# Patient Record
Sex: Female | Born: 1995 | Race: White | Hispanic: No | Marital: Single | Smoking: Never smoker
Health system: Southern US, Community
[De-identification: ages and names within clinical notes are randomized; demographics above are authoritative.]

## PROBLEM LIST (undated history)

## (undated) DIAGNOSIS — N159 Renal tubulo-interstitial disease, unspecified: Secondary | ICD-10-CM

## (undated) DIAGNOSIS — K8020 Calculus of gallbladder without cholecystitis without obstruction: Secondary | ICD-10-CM

## (undated) DIAGNOSIS — N83209 Unspecified ovarian cyst, unspecified side: Secondary | ICD-10-CM

## (undated) DIAGNOSIS — F32A Depression, unspecified: Secondary | ICD-10-CM

## (undated) DIAGNOSIS — F419 Anxiety disorder, unspecified: Secondary | ICD-10-CM

## (undated) HISTORY — DX: Depression, unspecified: F32.A

## (undated) HISTORY — DX: Unspecified ovarian cyst, unspecified side: N83.209

## (undated) HISTORY — DX: Renal tubulo-interstitial disease, unspecified: N15.9

## (undated) HISTORY — PX: MENISCUS REPAIR: SHX5179

## (undated) HISTORY — DX: Anxiety disorder, unspecified: F41.9

## (undated) HISTORY — DX: Calculus of gallbladder without cholecystitis without obstruction: K80.20

---

## 2015-10-02 ENCOUNTER — Other Ambulatory Visit: Payer: Self-pay | Admitting: General Surgery

## 2015-11-15 ENCOUNTER — Emergency Department (HOSPITAL_COMMUNITY): Payer: 59

## 2015-11-15 ENCOUNTER — Emergency Department (HOSPITAL_COMMUNITY)
Admission: EM | Admit: 2015-11-15 | Discharge: 2015-11-16 | Disposition: A | Discharge status: Home / Self Care | Payer: 59 | Attending: Emergency Medicine | Admitting: Emergency Medicine

## 2015-11-15 ENCOUNTER — Encounter: Payer: Self-pay | Admitting: Emergency Medicine

## 2015-11-15 DIAGNOSIS — Z79899 Other long term (current) drug therapy: Secondary | ICD-10-CM | POA: Diagnosis not present

## 2015-11-15 DIAGNOSIS — Z791 Long term (current) use of non-steroidal anti-inflammatories (NSAID): Secondary | ICD-10-CM | POA: Diagnosis not present

## 2015-11-15 DIAGNOSIS — N3000 Acute cystitis without hematuria: Secondary | ICD-10-CM | POA: Diagnosis not present

## 2015-11-15 DIAGNOSIS — Z91040 Latex allergy status: Secondary | ICD-10-CM | POA: Insufficient documentation

## 2015-11-15 DIAGNOSIS — R1031 Right lower quadrant pain: Secondary | ICD-10-CM | POA: Diagnosis present

## 2015-11-15 LAB — COMPREHENSIVE METABOLIC PANEL
ALBUMIN: 4.3 g/dL (ref 3.5–5.0)
ALT: 13 U/L — ABNORMAL LOW (ref 14–54)
AST: 20 U/L (ref 15–41)
Alkaline Phosphatase: 41 U/L (ref 38–126)
Anion gap: 5 (ref 5–15)
BUN: 9 mg/dL (ref 6–20)
CHLORIDE: 110 mmol/L (ref 101–111)
CO2: 24 mmol/L (ref 22–32)
Calcium: 9.5 mg/dL (ref 8.9–10.3)
Creatinine, Ser: 0.63 mg/dL (ref 0.44–1.00)
GFR calc Af Amer: >60 mL/min (ref >60)
Glucose, Bld: 98 mg/dL (ref 65–99)
POTASSIUM: 3.9 mmol/L (ref 3.5–5.1)
SODIUM: 139 mmol/L (ref 135–145)
Total Bilirubin: 0.2 mg/dL — ABNORMAL LOW (ref 0.3–1.2)
Total Protein: 7 g/dL (ref 6.5–8.1)

## 2015-11-15 LAB — CBC
HEMATOCRIT: 34.7 % — AB (ref 36.0–46.0)
Hemoglobin: 11.7 g/dL — ABNORMAL LOW (ref 12.0–15.0)
MCH: 30.3 pg (ref 26.0–34.0)
MCHC: 33.7 g/dL (ref 30.0–36.0)
MCV: 89.9 fL (ref 78.0–100.0)
Platelets: 259 10*3/uL (ref 150–400)
RBC: 3.86 MIL/uL — ABNORMAL LOW (ref 3.87–5.11)
RDW: 12.7 % (ref 11.5–15.5)
WBC: 8 10*3/uL (ref 4.0–10.5)

## 2015-11-15 LAB — LIPASE, BLOOD: Lipase: 38 U/L (ref 11–51)

## 2015-11-15 MED ORDER — ONDANSETRON HCL 4 MG/2ML IJ SOLN
4.0000 mg | Freq: Once | INTRAMUSCULAR | Status: AC
  Administered 2015-11-16: 4 mg via INTRAVENOUS
  Filled 2015-11-15: qty 2

## 2015-11-15 MED ORDER — SODIUM CHLORIDE 0.9 % IV SOLN
1000.0000 mL | INTRAVENOUS | Status: DC
  Administered 2015-11-15: 1000 mL via INTRAVENOUS

## 2015-11-15 MED ORDER — IOPAMIDOL (ISOVUE-300) INJECTION 61%
100.0000 mL | Freq: Once | INTRAVENOUS | Status: AC | PRN
  Administered 2015-11-16: 100 mL via INTRAVENOUS

## 2015-11-15 MED ORDER — MORPHINE SULFATE (PF) 4 MG/ML IV SOLN
4.0000 mg | INTRAVENOUS | Status: DC | PRN
  Administered 2015-11-16 (×2): 4 mg via INTRAVENOUS
  Filled 2015-11-15 (×2): qty 1

## 2015-11-15 MED ORDER — SODIUM CHLORIDE 0.9 % IV SOLN
1000.0000 mL | Freq: Once | INTRAVENOUS | Status: AC
  Administered 2015-11-15: 1000 mL via INTRAVENOUS

## 2015-11-15 MED ORDER — DIATRIZOATE MEGLUMINE & SODIUM 66-10 % PO SOLN
15.0000 mL | Freq: Once | ORAL | Status: AC
  Administered 2015-11-16: 15 mL via ORAL

## 2015-11-15 NOTE — ED Provider Notes (Signed)
[facility]-EMERGENCY DEPT Provider Note   CSN: [id number] Arrival date & time: 11/15/15  2244  By signing my name below, I, Freida Busman, attest that this documentation has been prepared under the direction and in the presence of Azalia Bilis, MD . Electronically Signed: Freida Busman, Scribe. 11/15/2015. 11:40 PM.    History   Chief Complaint Chief Complaint  Patient presents with  . Abdominal Pain     The history is provided by the patient. No language interpreter was used.    HPI Comments:  Chelsea Becker is a 20 y.o. female who presents to the Emergency Department complaining of gradually worsening, constant RLQ pain x 2 days. She reports associated nausea and chills. She reports h/o ovarian cyst; states she was evaluated this AM for her pain and had a negative [place] today. She denies urinary symptoms. No h/o appendectomy.   History reviewed. No pertinent past medical history.  There are no active problems to display for this patient.   History reviewed. No pertinent surgical history.  OB History    Gravida Para Term Preterm AB Living   1             SAB TAB Ectopic Multiple Live Births                   Home Medications    Prior to Admission medications   Medication Sig Start Date End Date Taking? Authorizing Provider  acetaminophen (TYLENOL) 500 MG tablet Take 1,000 mg by mouth every 6 (six) hours as needed (for pain.).   Yes Historical Provider, MD  ibuprofen (ADVIL,MOTRIN) 200 MG tablet Take 200 mg by mouth every 6 (six) hours as needed.   Yes Historical Provider, MD  JUNEL FE 1.5/30 1.5-30 MG-MCG tablet Take 1 tablet by mouth daily. 10/24/15  Yes Historical Provider, MD    Family History No family history on file.  Social History Social History  Substance Use Topics  . Smoking status: Never Smoker  . Smokeless tobacco: Never Used  . Alcohol use No     Allergies   Latex and Penicillins   Review of Systems Review of Systems  10 systems reviewed and all  are negative for acute change except as noted in the HPI.    Physical Exam Updated Vital Signs BP 116/84 (BP Location: Left Arm)   Pulse 78   Temp 98 F (36.7 C) (Oral)   Resp 16   Ht 5\' 5"  (1.651 m)   Wt 98 lb (44.5 kg)   LMP 10/21/2015 (Approximate) Comment: negative pregnancy test result.  SpO2 100%   BMI 16.31 kg/m   Physical Exam  Constitutional: She is oriented to person, place, and time. She appears well-developed and well-nourished. No distress.  HENT:  Head: Normocephalic and atraumatic.  Eyes: EOM are normal.  Neck: Normal range of motion.  Cardiovascular: Normal rate, regular rhythm and normal heart sounds.   Pulmonary/Chest: Effort normal and breath sounds normal.  Abdominal: Soft. She exhibits no distension. There is tenderness in the right lower quadrant.  Musculoskeletal: Normal range of motion.  Neurological: She is alert and oriented to person, place, and time.  Skin: Skin is warm and dry.  Psychiatric: She has a normal mood and affect. Judgment normal.  Nursing note and vitals reviewed.    ED Treatments / Results  DIAGNOSTIC STUDIES:  Oxygen Saturation is 99% on RA, normal by my interpretation.    COORDINATION OF CARE:  11:35 PM Discussed treatment plan with pt at bedside  and pt agreed to plan.  Labs (all labs ordered are listed, but only abnormal results are displayed) Labs Reviewed  COMPREHENSIVE METABOLIC PANEL - Abnormal; Notable for the following:       Result Value   ALT 13 (*)    Total Bilirubin 0.2 (*)    All other components within normal limits  CBC - Abnormal; Notable for the following:    RBC 3.86 (*)    Hemoglobin 11.7 (*)    HCT 34.7 (*)    All other components within normal limits  URINALYSIS, ROUTINE W REFLEX MICROSCOPIC (NOT AT [facility]) - Abnormal; Notable for the following:    APPearance CLOUDY (*)    Leukocytes, UA LARGE (*)    All other components within normal limits  URINE MICROSCOPIC-ADD ON - Abnormal; Notable for the  following:    Squamous Epithelial / LPF 6-30 (*)    Bacteria, UA FEW (*)    All other components within normal limits  URINE CULTURE  LIPASE, BLOOD  PREGNANCY, URINE    EKG  EKG Interpretation None       Radiology Ct Abdomen Pelvis W Contrast  Result Date: 11/16/2015 CLINICAL DATA:  Right lower quadrant pain.  Tenderness on exam. EXAM: CT ABDOMEN AND PELVIS WITH CONTRAST TECHNIQUE: Multidetector CT imaging of the abdomen and pelvis was performed using the standard protocol following bolus administration of intravenous contrast. CONTRAST:  ISOVUE-300 IOPAMIDOL (ISOVUE-300) INJECTION 61% COMPARISON:  None. FINDINGS: Lower chest:  The included lung bases are clear. Liver: Tiny subcentimeter hypodensity in the subcapsular right lobe of the liver it is too small to characterize, likely an incidental cyst. Hepatobiliary: Gallbladder physiologically distended, no calcified stone. No biliary dilatation. Pancreas: No ductal dilatation or inflammation. Spleen: Normal. Adrenal glands: No nodule. Kidneys: Mild prominence of the right renal pelvis renal collecting system with slight urothelial enhancement. Homogeneous symmetric renal enhancement. No urolithiasis. No intrarenal or perirenal fluid collection. Stomach/Bowel: Stomach physiologically distended. There are no dilated or thickened small bowel loops. Moderate stool burden without colonic wall thickening. The appendix is normal. Vascular/Lymphatic: No retroperitoneal adenopathy. Abdominal aorta is normal in caliber. Reproductive: Prominent periuterine and adnexal vascularity. Ovaries not discretely identified. No adnexal mass. Mild prominence of the left ovarian veins. Bladder: Mildly distended without wall thickening. Other: Small amount of free fluid in the pelvis measures simple fluid density. No free air. No intra-abdominal abscess. Musculoskeletal: There are no acute or suspicious osseous abnormalities. IMPRESSION: 1. Normal appendix. 2. Mild  right urothelial enhancement, suspicious for ascending urinary tract infection. Electronically Signed   By: Rubye Oaks M.D.   On: 11/16/2015 01:09    Procedures Procedures (including critical care time)  Medications Ordered in ED Medications  morphine 4 MG/ML injection 4 mg (4 mg Intravenous Given 11/16/15 0153)  0.9 %  sodium chloride infusion (0 mLs Intravenous Stopped 11/16/15 0153)    Followed by  0.9 %  sodium chloride infusion (0 mLs Intravenous Stopped 11/16/15 0153)  cefTRIAXone (ROCEPHIN) 1 g in dextrose 5 % 50 mL IVPB (1 g Intravenous New Bag/Given 11/16/15 0152)  ondansetron (ZOFRAN) injection 4 mg (4 mg Intravenous Given 11/16/15 0001)  diatrizoate meglumine-sodium (GASTROGRAFIN) 66-10 % solution 15 mL (15 mLs Oral Given 11/16/15 0048)  iopamidol (ISOVUE-300) 61 % injection 100 mL (100 mLs Intravenous Contrast Given 11/16/15 0047)     Initial Impression / Assessment and Plan / ED Course  I have reviewed the triage vital signs and the nursing notes.  Pertinent labs & imaging results  that were available during my care of the patient were reviewed by me and considered in my medical decision making (see chart for details).  Clinical Course    2:23 AM Patient feels better this time.  Appendix normal on CT imaging.  Likely a ascending urinary tract infection.  Urine culture sent.  Rocephin in the emergency department.  Home with Keflex, nausea medicine, pain medicine.  Patient understands return to the ER for new or worsening symptoms.  Overall well-appearing.  Nontoxic.  Final Clinical Impressions(s) / ED Diagnoses   Final diagnoses:  Right lower quadrant abdominal pain  Acute cystitis without hematuria    New Prescriptions New Prescriptions   CEPHALEXIN (KEFLEX) 500 MG CAPSULE    Take 1 capsule (500 mg total) by mouth 3 (three) times daily.   HYDROCODONE-ACETAMINOPHEN (NORCO/VICODIN) 5-325 MG TABLET    Take 1 tablet by mouth every 6 (six) hours as needed for moderate  pain.   ONDANSETRON (ZOFRAN ODT) 8 MG DISINTEGRATING TABLET    Take 1 tablet (8 mg total) by mouth every 8 (eight) hours as needed for nausea or vomiting.    I personally performed the services described in this documentation, which was scribed in my presence. The recorded information has been reviewed and is accurate.        Azalia BilisKevin Bernardo Brayman, MD 11/16/15 [phone]

## 2015-11-15 NOTE — ED Triage Notes (Signed)
Pt complains of RLQ pain since yesterday. Pt had ultrasound performed at PCP, which was negative. Pt states her pain feels worse.

## 2015-11-16 LAB — URINE MICROSCOPIC-ADD ON: RBC / HPF: NONE SEEN RBC/hpf (ref 0–5)

## 2015-11-16 LAB — URINALYSIS, ROUTINE W REFLEX MICROSCOPIC
BILIRUBIN URINE: NEGATIVE
Glucose, UA: NEGATIVE mg/dL
HGB URINE DIPSTICK: NEGATIVE
KETONES UR: NEGATIVE mg/dL
Nitrite: NEGATIVE
PROTEIN: NEGATIVE mg/dL
Specific Gravity, Urine: 1.022 (ref 1.005–1.030)
pH: 6.5 (ref 5.0–8.0)

## 2015-11-16 LAB — PREGNANCY, URINE: PREG TEST UR: NEGATIVE

## 2015-11-16 MED ORDER — CEFTRIAXONE SODIUM 1 G IJ SOLR
1.0000 g | Freq: Once | INTRAMUSCULAR | Status: AC
  Administered 2015-11-16: 1 g via INTRAVENOUS
  Filled 2015-11-16: qty 10

## 2015-11-16 MED ORDER — CEPHALEXIN 500 MG PO CAPS: 500.0000 mg | ORAL_CAPSULE | Freq: Three times a day (TID) | ORAL | 0 refills | Status: DC

## 2015-11-16 MED ORDER — ONDANSETRON 8 MG PO TBDP: 8.0000 mg | ORAL_TABLET | Freq: Three times a day (TID) | ORAL | 0 refills | Status: DC | PRN

## 2015-11-16 MED ORDER — HYDROCODONE-ACETAMINOPHEN 5-325 MG PO TABS: 1.0000 | ORAL_TABLET | Freq: Four times a day (QID) | ORAL | 0 refills | Status: DC | PRN

## 2015-11-16 NOTE — ED Notes (Signed)
Pt returned from CT °

## 2015-11-17 LAB — URINE CULTURE

## 2015-11-22 ENCOUNTER — Encounter (HOSPITAL_COMMUNITY): Payer: Self-pay | Admitting: Emergency Medicine

## 2015-11-22 ENCOUNTER — Emergency Department (HOSPITAL_COMMUNITY)
Admission: EM | Admit: 2015-11-22 | Discharge: 2015-11-22 | Disposition: A | Discharge status: Home / Self Care | Payer: 59 | Attending: Emergency Medicine | Admitting: Emergency Medicine

## 2015-11-22 DIAGNOSIS — R110 Nausea: Secondary | ICD-10-CM | POA: Insufficient documentation

## 2015-11-22 DIAGNOSIS — N760 Acute vaginitis: Secondary | ICD-10-CM | POA: Insufficient documentation

## 2015-11-22 DIAGNOSIS — Z792 Long term (current) use of antibiotics: Secondary | ICD-10-CM | POA: Diagnosis not present

## 2015-11-22 DIAGNOSIS — R102 Pelvic and perineal pain: Secondary | ICD-10-CM

## 2015-11-22 LAB — URINALYSIS, ROUTINE W REFLEX MICROSCOPIC
BILIRUBIN URINE: NEGATIVE
GLUCOSE, UA: NEGATIVE mg/dL
Hgb urine dipstick: NEGATIVE
KETONES UR: NEGATIVE mg/dL
NITRITE: NEGATIVE
PH: 6 (ref 5.0–8.0)
Protein, ur: NEGATIVE mg/dL
Specific Gravity, Urine: 1.004 — ABNORMAL LOW (ref 1.005–1.030)

## 2015-11-22 LAB — COMPREHENSIVE METABOLIC PANEL
ALBUMIN: 4.5 g/dL (ref 3.5–5.0)
ALK PHOS: 38 U/L (ref 38–126)
ALT: 11 U/L — AB (ref 14–54)
AST: 20 U/L (ref 15–41)
Anion gap: 6 (ref 5–15)
BILIRUBIN TOTAL: 0.4 mg/dL (ref 0.3–1.2)
BUN: 6 mg/dL (ref 6–20)
CO2: 25 mmol/L (ref 22–32)
CREATININE: 0.72 mg/dL (ref 0.44–1.00)
Calcium: 9.5 mg/dL (ref 8.9–10.3)
Chloride: 107 mmol/L (ref 101–111)
GFR calc Af Amer: >60 mL/min (ref >60)
GFR calc non Af Amer: >60 mL/min (ref >60)
GLUCOSE: 115 mg/dL — AB (ref 65–99)
POTASSIUM: 3.7 mmol/L (ref 3.5–5.1)
Sodium: 138 mmol/L (ref 135–145)
TOTAL PROTEIN: 7.9 g/dL (ref 6.5–8.1)

## 2015-11-22 LAB — CBC
HEMATOCRIT: 38.2 % (ref 36.0–46.0)
Hemoglobin: 13 g/dL (ref 12.0–15.0)
MCH: 31.3 pg (ref 26.0–34.0)
MCHC: 34 g/dL (ref 30.0–36.0)
MCV: 92 fL (ref 78.0–100.0)
Platelets: 289 10*3/uL (ref 150–400)
RBC: 4.15 MIL/uL (ref 3.87–5.11)
RDW: 12.6 % (ref 11.5–15.5)
WBC: 8.1 10*3/uL (ref 4.0–10.5)

## 2015-11-22 LAB — URINE MICROSCOPIC-ADD ON: RBC / HPF: NONE SEEN RBC/hpf (ref 0–5)

## 2015-11-22 LAB — WET PREP, GENITAL
Sperm: NONE SEEN
Trich, Wet Prep: NONE SEEN
YEAST WET PREP: NONE SEEN

## 2015-11-22 LAB — LIPASE, BLOOD: Lipase: 31 U/L (ref 11–51)

## 2015-11-22 MED ORDER — METRONIDAZOLE 500 MG PO TABS: 500.0000 mg | ORAL_TABLET | Freq: Two times a day (BID) | ORAL | 0 refills | Status: DC

## 2015-11-22 MED ORDER — IBUPROFEN 800 MG PO TABS: 800.0000 mg | ORAL_TABLET | Freq: Three times a day (TID) | ORAL | 0 refills | Status: DC | PRN

## 2015-11-22 MED ORDER — FLUCONAZOLE 150 MG PO TABS
150.0000 mg | ORAL_TABLET | Freq: Once | ORAL | Status: AC
  Administered 2015-11-22: 150 mg via ORAL
  Filled 2015-11-22: qty 1

## 2015-11-22 MED ORDER — KETOROLAC TROMETHAMINE 60 MG/2ML IM SOLN
30.0000 mg | Freq: Once | INTRAMUSCULAR | Status: AC
  Administered 2015-11-22: 30 mg via INTRAMUSCULAR
  Filled 2015-11-22: qty 2

## 2015-11-22 NOTE — ED Provider Notes (Signed)
[facility]-EMERGENCY DEPT Provider Note   CSN: [id number] Arrival date & time: 11/22/15  1710     History   Chief Complaint Chief Complaint  Patient presents with  . Abdominal Pain  . Nausea    HPI Chelsea Becker is a 20 y.o. female.  HPI   Patient presents with persistent RLQ pain that has been ongoing x 2 weeks.  Pain is sharp and intermittent.  She has also had abnormal vaginal discharge.  LMP mid-July.  Recently started birth control.  Has been seen for this several times at [facility] (gyn) and had US to r/o new ovarian cyst but has not had pelvic exam.  Was seen in ED 11/15/15 with CT abd/pelvis that showed a normal appendix, ureteral inflammation c/w ascending UTI.  Pt has developed fevers, chills, back pain. The abdominal pain has remained consistent, no sudden worsening or change.  Pt was also seen at a [facility] ED and had CT abd/pelvis in early July that showed ovarian cysts. No hx abdominal surgeries.  Denies urinary symptoms.  Has had mild constipation since starting the vicodin.  Has taken keflex, vicodin, nausea medication.  Denies having ever been sexually active.    History reviewed. No pertinent past medical history.  There are no active problems to display for this patient.   History reviewed. No pertinent surgical history.  OB History    Gravida Para Term Preterm AB Living   1             SAB TAB Ectopic Multiple Live Births                   Home Medications    Prior to Admission medications   Medication Sig Start Date End Date Taking? Authorizing Provider  acetaminophen (TYLENOL) 500 MG tablet Take 1,000 mg by mouth every 6 (six) hours as needed (for pain.).   Yes Historical Provider, MD  cephALEXin (KEFLEX) 500 MG capsule Take 1 capsule (500 mg total) by mouth 3 (three) times daily. 11/16/15  Yes Azalia BilisKevin Campos, MD  HYDROcodone-acetaminophen (NORCO/VICODIN) 5-325 MG tablet Take 1 tablet by mouth every 6 (six) hours as needed for moderate pain. 11/16/15   Yes Azalia BilisKevin Campos, MD  JUNEL FE 1.5/30 1.5-30 MG-MCG tablet Take 1 tablet by mouth daily. 10/24/15  Yes Historical Provider, MD  ondansetron (ZOFRAN ODT) 8 MG disintegrating tablet Take 1 tablet (8 mg total) by mouth every 8 (eight) hours as needed for nausea or vomiting. 11/16/15  Yes Azalia BilisKevin Campos, MD  ibuprofen (ADVIL,MOTRIN) 800 MG tablet Take 1 tablet (800 mg total) by mouth every 8 (eight) hours as needed for mild pain or moderate pain. 11/22/15   Trixie DredgeEmily Aren Cherne, PA-C  metroNIDAZOLE (FLAGYL) 500 MG tablet Take 1 tablet (500 mg total) by mouth 2 (two) times daily. One po bid x 7 days 11/22/15   Trixie DredgeEmily Amiel Sharrow, PA-C    Family History No family history on file.  Social History Social History  Substance Use Topics  . Smoking status: Never Smoker  . Smokeless tobacco: Never Used  . Alcohol use No     Allergies   Latex and Penicillins   Review of Systems Review of Systems  All other systems reviewed and are negative.    Physical Exam Updated Vital Signs BP 115/72 (BP Location: Left Arm)   Pulse 86   Temp 98.3 F (36.8 C) (Oral)   Resp 17   Ht 5\' 5"  (1.651 m)   Wt 44.5 kg   LMP  10/21/2015 (Approximate) Comment: negative pregnancy test result.  SpO2 100%   Breastfeeding? Unknown   BMI 16.31 kg/m   Physical Exam  Constitutional: She appears well-developed and well-nourished. No distress.  HENT:  Head: Normocephalic and atraumatic.  Neck: Neck supple.  Cardiovascular: Normal rate and regular rhythm.   Pulmonary/Chest: Effort normal and breath sounds normal. No respiratory distress. She has no wheezes. She has no rales.  Abdominal: Soft. She exhibits no distension. There is tenderness. There is no rebound and no guarding.    Genitourinary:  Genitourinary Comments: Thick white vaginal discharge with thin discharge in vagina.  Vagina is tender throughout.  Pt does not tolerate full speculum exam and does not tolerate bimanual.  Repalpation of pelvis external demonstrates tenderness  throughout, left, right, midline.  No rebound or guarding.  No masses or fullness.    Neurological: She is alert.  Skin: She is not diaphoretic.  Nursing note and vitals reviewed.    ED Treatments / Results  Labs (all labs ordered are listed, but only abnormal results are displayed) Labs Reviewed  WET PREP, GENITAL - Abnormal; Notable for the following:       Result Value   Clue Cells Wet Prep HPF POC PRESENT (*)    WBC, Wet Prep HPF POC MANY (*)    All other components within normal limits  COMPREHENSIVE METABOLIC PANEL - Abnormal; Notable for the following:    Glucose, Bld 115 (*)    ALT 11 (*)    All other components within normal limits  URINALYSIS, ROUTINE W REFLEX MICROSCOPIC (NOT AT [facility]) - Abnormal; Notable for the following:    Specific Gravity, Urine 1.004 (*)    Leukocytes, UA SMALL (*)    All other components within normal limits  URINE MICROSCOPIC-ADD ON - Abnormal; Notable for the following:    Squamous Epithelial / LPF 0-5 (*)    Bacteria, UA RARE (*)    All other components within normal limits  URINE CULTURE  LIPASE, BLOOD  CBC  RPR  HIV ANTIBODY (ROUTINE TESTING)  GC/CHLAMYDIA PROBE AMP () NOT AT [facility]    EKG  EKG Interpretation None       Radiology No results found.  Procedures Procedures (including critical care time)  Medications Ordered in ED Medications  ketorolac (TORADOL) injection 30 mg (30 mg Intramuscular Given 11/22/15 2210)  fluconazole (DIFLUCAN) tablet 150 mg (150 mg Oral Given 11/22/15 2210)     Initial Impression / Assessment and Plan / ED Course  I have reviewed the triage vital signs and the nursing notes.  Pertinent labs & imaging results that were available during my care of the patient were reviewed by me and considered in my medical decision making (see chart for details).  Clinical Course    Afebrile, nontoxic patient with pelvic pain, abnormal vaginal discharge.  Pt has significant amount of discharge and  tenderness.  Pt denies any history of sexual activity or any possibility of STDs.  Will treat for clinical yeast infection as well as BV.  UA does not appear infected, will send for culture.  Labs unremarkable.  Pt has had pelvic and transvaginal US in the past and does not tolerate them well at all.  Pt has had [place] in gyn office this week negative for cysts.  Doubt TOA, doubt torsion.   D/C home with flagyl, motrin, gyn follow up.  Discussed result, findings, treatment, and follow up  with patient.  Pt given return precautions.  Pt verbalizes understanding  and agrees with plan.       Final Clinical Impressions(s) / ED Diagnoses   Final diagnoses:  Vaginitis  Pelvic pain in female    New Prescriptions New Prescriptions   IBUPROFEN (ADVIL,MOTRIN) 800 MG TABLET    Take 1 tablet (800 mg total) by mouth every 8 (eight) hours as needed for mild pain or moderate pain.   METRONIDAZOLE (FLAGYL) 500 MG TABLET    Take 1 tablet (500 mg total) by mouth 2 (two) times daily. One po bid x 7 days     Trixie Dredge, PA-C 11/22/15 2226    Pricilla Loveless, MD 11/24/15 [phone]

## 2015-11-22 NOTE — Discharge Instructions (Signed)
Read the information below.  Use the prescribed medication as directed.  Please discuss all new medications with your pharmacist.  You may return to the Emergency Department at any time for worsening condition or any new symptoms that concern you.   If you develop high fevers, worsening abdominal pain, uncontrolled vomiting, or are unable to tolerate fluids by mouth, return to the ER for a recheck.  ° °

## 2015-11-22 NOTE — ED Notes (Signed)
Patient reports she was diagnosed with UTI 1.5 weeks ago. Has been taking antibiotics and pain medication without relief. Reports RLQ abdominal pain and nausea. Denies fever or emesis. A&O x4.

## 2015-11-23 LAB — HIV ANTIBODY (ROUTINE TESTING W REFLEX): HIV SCREEN 4TH GENERATION: NONREACTIVE

## 2015-11-23 LAB — RPR: RPR: NONREACTIVE

## 2015-11-24 LAB — URINE CULTURE

## 2015-11-26 LAB — GC/CHLAMYDIA PROBE AMP (~~LOC~~) NOT AT ARMC
Chlamydia: NEGATIVE
Neisseria Gonorrhea: NEGATIVE

## 2016-01-26 ENCOUNTER — Encounter (HOSPITAL_COMMUNITY): Payer: Self-pay | Admitting: Emergency Medicine

## 2016-01-26 ENCOUNTER — Emergency Department (HOSPITAL_COMMUNITY)
Admission: EM | Admit: 2016-01-26 | Discharge: 2016-01-26 | Disposition: A | Discharge status: Home / Self Care | Payer: 59 | Attending: Emergency Medicine | Admitting: Emergency Medicine

## 2016-01-26 ENCOUNTER — Emergency Department (HOSPITAL_COMMUNITY): Payer: 59

## 2016-01-26 DIAGNOSIS — Z79899 Other long term (current) drug therapy: Secondary | ICD-10-CM | POA: Diagnosis not present

## 2016-01-26 DIAGNOSIS — R51 Headache: Secondary | ICD-10-CM | POA: Diagnosis present

## 2016-01-26 DIAGNOSIS — R519 Headache, unspecified: Secondary | ICD-10-CM

## 2016-01-26 DIAGNOSIS — Z91040 Latex allergy status: Secondary | ICD-10-CM | POA: Insufficient documentation

## 2016-01-26 LAB — CBC WITH DIFFERENTIAL/PLATELET
Basophils Absolute: 0 10*3/uL (ref 0.0–0.1)
Basophils Relative: 1 %
Eosinophils Absolute: 0.2 10*3/uL (ref 0.0–0.7)
Eosinophils Relative: 2 %
HCT: 41.3 % (ref 36.0–46.0)
Hemoglobin: 14.1 g/dL (ref 12.0–15.0)
Lymphocytes Relative: 38 %
Lymphs Abs: 2.8 10*3/uL (ref 0.7–4.0)
MCH: 31.2 pg (ref 26.0–34.0)
MCHC: 34.1 g/dL (ref 30.0–36.0)
MCV: 91.4 fL (ref 78.0–100.0)
Monocytes Absolute: 0.4 10*3/uL (ref 0.1–1.0)
Monocytes Relative: 5 %
Neutro Abs: 4.1 10*3/uL (ref 1.7–7.7)
Neutrophils Relative %: 54 %
Platelets: 304 10*3/uL (ref 150–400)
RBC: 4.52 MIL/uL (ref 3.87–5.11)
RDW: 12 % (ref 11.5–15.5)
WBC: 7.4 10*3/uL (ref 4.0–10.5)

## 2016-01-26 LAB — BASIC METABOLIC PANEL
Anion gap: 8 (ref 5–15)
BUN: 8 mg/dL (ref 6–20)
CO2: 27 mmol/L (ref 22–32)
Calcium: 10.1 mg/dL (ref 8.9–10.3)
Chloride: 106 mmol/L (ref 101–111)
Creatinine, Ser: 0.69 mg/dL (ref 0.44–1.00)
GFR calc Af Amer: >60 mL/min (ref >60)
GFR calc non Af Amer: >60 mL/min (ref >60)
Glucose, Bld: 83 mg/dL (ref 65–99)
Potassium: 3.8 mmol/L (ref 3.5–5.1)
Sodium: 141 mmol/L (ref 135–145)

## 2016-01-26 LAB — RAPID URINE DRUG SCREEN, HOSP PERFORMED
Amphetamines: NOT DETECTED
Barbiturates: NOT DETECTED
Benzodiazepines: NOT DETECTED
Cocaine: NOT DETECTED
Opiates: NOT DETECTED
Tetrahydrocannabinol: NOT DETECTED

## 2016-01-26 LAB — URINE MICROSCOPIC-ADD ON
Bacteria, UA: NONE SEEN
RBC / HPF: NONE SEEN RBC/hpf (ref 0–5)

## 2016-01-26 LAB — URINALYSIS, ROUTINE W REFLEX MICROSCOPIC
Bilirubin Urine: NEGATIVE
Glucose, UA: NEGATIVE mg/dL
Ketones, ur: NEGATIVE mg/dL
Nitrite: NEGATIVE
Protein, ur: NEGATIVE mg/dL
Specific Gravity, Urine: 1.011 (ref 1.005–1.030)
pH: 7 (ref 5.0–8.0)

## 2016-01-26 LAB — PREGNANCY, URINE: Preg Test, Ur: NEGATIVE

## 2016-01-26 MED ORDER — KETOROLAC TROMETHAMINE 30 MG/ML IJ SOLN
30.0000 mg | Freq: Once | INTRAMUSCULAR | Status: AC
  Administered 2016-01-26: 30 mg via INTRAVENOUS
  Filled 2016-01-26: qty 1

## 2016-01-26 MED ORDER — PROCHLORPERAZINE EDISYLATE 5 MG/ML IJ SOLN
5.0000 mg | Freq: Once | INTRAMUSCULAR | Status: AC
  Administered 2016-01-26: 5 mg via INTRAVENOUS
  Filled 2016-01-26: qty 2

## 2016-01-26 MED ORDER — SODIUM CHLORIDE 0.9 % IV BOLUS (SEPSIS)
1000.0000 mL | Freq: Once | INTRAVENOUS | Status: AC
  Administered 2016-01-26: 1000 mL via INTRAVENOUS

## 2016-01-26 MED ORDER — DIPHENHYDRAMINE HCL 50 MG/ML IJ SOLN
25.0000 mg | Freq: Once | INTRAMUSCULAR | Status: AC
  Administered 2016-01-26: 25 mg via INTRAVENOUS
  Filled 2016-01-26: qty 1

## 2016-01-26 NOTE — Discharge Instructions (Signed)
Please follow-up with neurology, either one of the offices listed below or another neurologist near [place]. You can take ibuprofen or Tylenol as needed for your headache turns. Make sure to drink plenty of water. Please return to emergency department if you develop any new or worsening symptoms.

## 2016-01-26 NOTE — ED Notes (Signed)
Patient transported to CT 

## 2016-01-26 NOTE — ED Provider Notes (Signed)
[facility]-EMERGENCY DEPT Provider Note   CSN: [id number] Arrival date & time: 01/26/16  1335     History   Chief Complaint Chief Complaint  Patient presents with  . Headache  . Hallucinations    HPI Chelsea Becker is a 20 y.o. female with history of 3 past concussions, one most recently 1 month ago, who presents with headache and visual hallucinations. Patient reports she began with a throbbing, temporal headache 5 days ago. Patient had 2 episodes of visual hallucinations on evening of day one in the morning of day 2, seeing spiders that weren't present. She denies any auditory hallucinations, SI or HI. Patient was diagnosed with a mild concussion 1 month ago after hitting her head on the dryer door. She experiences headache, nausea, irritability, confusion. The symptoms resolved within 1 week. She does not have a CT at that time. Patient notes that [place] [employer] [place] and was drinking drinks that were supposedly being watched, however she is not sure if the drinks may have been spiked. Associated nausea, fatigue. She denies any vision changes, fevers, chest pain, shortness of breath, abdominal pain, vomiting, urinary symptoms.   Headache   Pertinent negatives include no fever, no shortness of breath, no nausea and no vomiting.    History reviewed. No pertinent past medical history.  There are no active problems to display for this patient.   History reviewed. No pertinent surgical history.  OB History    Gravida Para Term Preterm AB Living   1             SAB TAB Ectopic Multiple Live Births                   Home Medications    Prior to Admission medications   Medication Sig Start Date End Date Taking? Authorizing Provider  acetaminophen (TYLENOL) 500 MG tablet Take 1,000 mg by mouth every 6 (six) hours as needed for mild pain or headache.    Yes Historical Provider, MD  JUNEL FE 1.5/30 1.5-30 MG-MCG tablet Take 1 tablet by mouth daily. 10/24/15  Yes  Historical Provider, MD  cephALEXin (KEFLEX) 500 MG capsule Take 1 capsule (500 mg total) by mouth 3 (three) times daily. Patient not taking: Reported on 01/26/2016 11/16/15   Azalia BilisKevin Campos, MD  ibuprofen (ADVIL,MOTRIN) 800 MG tablet Take 1 tablet (800 mg total) by mouth every 8 (eight) hours as needed for mild pain or moderate pain. Patient not taking: Reported on 01/26/2016 11/22/15   Trixie DredgeEmily West, PA-C  metroNIDAZOLE (FLAGYL) 500 MG tablet Take 1 tablet (500 mg total) by mouth 2 (two) times daily. One po bid x 7 days Patient not taking: Reported on 01/26/2016 11/22/15   Trixie DredgeEmily West, PA-C    Family History History reviewed. No pertinent family history.  Social History Social History  Substance Use Topics  . Smoking status: Never Smoker  . Smokeless tobacco: Never Used  . Alcohol use No     Allergies   Latex and Penicillins   Review of Systems Review of Systems  Constitutional: Negative for chills and fever.  HENT: Negative for facial swelling and sore throat.   Eyes: Negative for visual disturbance.  Respiratory: Negative for shortness of breath.   Cardiovascular: Negative for chest pain.  Gastrointestinal: Negative for abdominal pain, nausea and vomiting.  Genitourinary: Negative for dysuria.  Musculoskeletal: Negative for back pain, neck pain and neck stiffness.  Skin: Negative for rash and wound.  Neurological: Positive for  light-headedness and headaches.  Psychiatric/Behavioral: Positive for hallucinations. The patient is not nervous/anxious.      Physical Exam Updated Vital Signs BP 106/74   Pulse 72   Temp 98.2 F (36.8 C)   Resp 16   Ht 5\' 4"  (1.626 m)   Wt 45.4 kg   SpO2 100%   BMI 17.16 kg/m   Physical Exam  Constitutional: She appears well-developed and well-nourished. No distress.  HENT:  Head: Normocephalic and atraumatic.  Mouth/Throat: Oropharynx is clear and moist. No oropharyngeal exudate.  Eyes: Conjunctivae and EOM are normal. Pupils are equal,  round, and reactive to light. Right eye exhibits no discharge. Left eye exhibits no discharge. No scleral icterus.  Neck: Normal range of motion and full passive range of motion without pain. Neck supple. No spinous process tenderness and no muscular tenderness present. No neck rigidity. Normal range of motion present. No thyromegaly present.  Cardiovascular: Normal rate, regular rhythm, normal heart sounds and intact distal pulses.  Exam reveals no gallop and no friction rub.   No murmur heard. Pulmonary/Chest: Effort normal and breath sounds normal. No stridor. No respiratory distress. She has no wheezes. She has no rales.  Abdominal: Soft. Bowel sounds are normal. She exhibits no distension. There is no tenderness. There is no rebound and no guarding.  Musculoskeletal: She exhibits no edema.  Lymphadenopathy:    She has no cervical adenopathy.  Neurological: She is alert. Coordination normal.  CN 3-12 intact; normal sensation throughout; 5/5 strength in all 4 extremities; equal bilateral grip strength   Skin: Skin is warm and dry. No rash noted. She is not diaphoretic. No pallor.  Psychiatric: She has a normal mood and affect.  Nursing note and vitals reviewed.    ED Treatments / Results  Labs (all labs ordered are listed, but only abnormal results are displayed) Labs Reviewed  URINALYSIS, ROUTINE W REFLEX MICROSCOPIC (NOT AT [facility]) - Abnormal; Notable for the following:       Result Value   Hgb urine dipstick TRACE (*)    Leukocytes, UA TRACE (*)    All other components within normal limits  URINE MICROSCOPIC-ADD ON - Abnormal; Notable for the following:    Squamous Epithelial / LPF 6-30 (*)    All other components within normal limits  PREGNANCY, URINE  BASIC METABOLIC PANEL  CBC WITH DIFFERENTIAL/PLATELET  RAPID URINE DRUG SCREEN, HOSP PERFORMED    EKG  EKG Interpretation None       Radiology Ct Head Wo Contrast  Result Date: 01/26/2016 CLINICAL DATA:  Headaches  since Wednesday and dizziness. Hallucinations. EXAM: CT HEAD WITHOUT CONTRAST TECHNIQUE: Contiguous axial images were obtained from the base of the skull through the vertex without intravenous contrast. COMPARISON:  None. FINDINGS: BRAIN: The ventricles and sulci are normal. No intraparenchymal hemorrhage, mass effect nor midline shift. No acute large vascular territory infarcts. No abnormal extra-axial fluid collections. Basal cisterns are patent. VASCULAR: Unremarkable. SKULL/SOFT TISSUES: No skull fracture. No significant soft tissue swelling. ORBITS/SINUSES: The included ocular globes and orbital contents are normal.The mastoid aircells and included paranasal sinuses are well-aerated. Concha bullosa of the middle turbinates bilaterally right greater than left. OTHER: None. IMPRESSION: No acute intracranial abnormality. Electronically Signed   By: Tollie Ethavid  Kwon M.D.   On: 01/26/2016 18:46    Procedures Procedures (including critical care time)  Medications Ordered in ED Medications  sodium chloride 0.9 % bolus 1,000 mL (0 mLs Intravenous Stopped 01/26/16 1848)  ketorolac (TORADOL) 30 MG/ML injection 30 mg (  30 mg Intravenous Given 01/26/16 1750)  prochlorperazine (COMPAZINE) injection 5 mg (5 mg Intravenous Given 01/26/16 1750)  diphenhydrAMINE (BENADRYL) injection 25 mg (25 mg Intravenous Given 01/26/16 1750)     Initial Impression / Assessment and Plan / ED Course  I have reviewed the triage vital signs and the nursing notes.  Pertinent labs & imaging results that were available during my care of the patient were reviewed by me and considered in my medical decision making (see chart for details).  Clinical Course    Patient symptoms resolved after 1 L fluid bolus, Toradol, Compazine, Benadryl.  CBC, BMP unremarkable. UA shows trace hematuria, trace leukocytes. You DS negative. Urine pregnancy negative. CT head shows no acute intracranial abnormality. Suspect posttraumatic headache or  postconcussion syndrome; or possible ingestion at patient's Halloween party. Doubt hallucinations are due to psychiatric condition. Patient advised to follow-up with neurology for further evaluation and treatment. Return precautions discussed. Patient and parents understand and agree with plan. Patient vitals stable throughout ED course and discharged in satisfactory condition. I discussed patient with Dr. Rubin Payor who got the patient's management and agrees with plan.  Final Clinical Impressions(s) / ED Diagnoses   Final diagnoses:  Bad headache    New Prescriptions New Prescriptions   No medications on file     Emi Holes, Cordelia Poche 01/26/16 1927    Benjiman Core, MD 01/27/16 [phone]

## 2016-01-26 NOTE — ED Triage Notes (Addendum)
Pt reports HA since Wednesday accompanied by dizziness.Had hallucination of spiders on Wednesday and Thursday, none since. No SI/HI. Had a concussion a month ago. No recent head injury since. Pt alert and oriented.

## 2016-04-13 ENCOUNTER — Ambulatory Visit: Payer: 59 | Admitting: Neurology

## 2017-01-22 ENCOUNTER — Emergency Department (HOSPITAL_COMMUNITY): Payer: 59

## 2017-01-22 ENCOUNTER — Encounter (HOSPITAL_COMMUNITY): Payer: Self-pay | Admitting: Emergency Medicine

## 2017-01-22 ENCOUNTER — Emergency Department (HOSPITAL_COMMUNITY)
Admission: EM | Admit: 2017-01-22 | Discharge: 2017-01-22 | Disposition: A | Discharge status: Home / Self Care | Payer: 59 | Attending: Emergency Medicine | Admitting: Emergency Medicine

## 2017-01-22 DIAGNOSIS — R1032 Left lower quadrant pain: Secondary | ICD-10-CM | POA: Diagnosis not present

## 2017-01-22 DIAGNOSIS — Z91040 Latex allergy status: Secondary | ICD-10-CM | POA: Diagnosis not present

## 2017-01-22 DIAGNOSIS — Z79899 Other long term (current) drug therapy: Secondary | ICD-10-CM | POA: Diagnosis not present

## 2017-01-22 DIAGNOSIS — R109 Unspecified abdominal pain: Secondary | ICD-10-CM

## 2017-01-22 LAB — URINALYSIS, ROUTINE W REFLEX MICROSCOPIC
BACTERIA UA: NONE SEEN
BILIRUBIN URINE: NEGATIVE
Glucose, UA: NEGATIVE mg/dL
HGB URINE DIPSTICK: NEGATIVE
Ketones, ur: NEGATIVE mg/dL
NITRITE: NEGATIVE
Protein, ur: NEGATIVE mg/dL
Specific Gravity, Urine: 1.01 (ref 1.005–1.030)
pH: 5 (ref 5.0–8.0)

## 2017-01-22 LAB — COMPREHENSIVE METABOLIC PANEL
ALBUMIN: 4.6 g/dL (ref 3.5–5.0)
ALK PHOS: 55 U/L (ref 38–126)
ALT: 23 U/L (ref 14–54)
ANION GAP: 9 (ref 5–15)
AST: 30 U/L (ref 15–41)
BUN: 11 mg/dL (ref 6–20)
CALCIUM: 10.3 mg/dL (ref 8.9–10.3)
CHLORIDE: 102 mmol/L (ref 101–111)
CO2: 29 mmol/L (ref 22–32)
Creatinine, Ser: 0.78 mg/dL (ref 0.44–1.00)
GFR calc non Af Amer: >60 mL/min (ref >60)
GLUCOSE: 84 mg/dL (ref 65–99)
POTASSIUM: 3.8 mmol/L (ref 3.5–5.1)
Sodium: 140 mmol/L (ref 135–145)
Total Bilirubin: 0.5 mg/dL (ref 0.3–1.2)
Total Protein: 8.1 g/dL (ref 6.5–8.1)

## 2017-01-22 LAB — CBC
HEMATOCRIT: 42.3 % (ref 36.0–46.0)
HEMOGLOBIN: 13.9 g/dL (ref 12.0–15.0)
MCH: 30.2 pg (ref 26.0–34.0)
MCHC: 32.9 g/dL (ref 30.0–36.0)
MCV: 91.8 fL (ref 78.0–100.0)
Platelets: 318 10*3/uL (ref 150–400)
RBC: 4.61 MIL/uL (ref 3.87–5.11)
RDW: 12.1 % (ref 11.5–15.5)
WBC: 6.8 10*3/uL (ref 4.0–10.5)

## 2017-01-22 LAB — I-STAT BETA HCG BLOOD, ED (MC, WL, AP ONLY)

## 2017-01-22 LAB — LIPASE, BLOOD: LIPASE: 27 U/L (ref 11–51)

## 2017-01-22 MED ORDER — SODIUM CHLORIDE 0.9 % IV BOLUS (SEPSIS)
1000.0000 mL | Freq: Once | INTRAVENOUS | Status: AC
  Administered 2017-01-22: 1000 mL via INTRAVENOUS

## 2017-01-22 MED ORDER — HYDROMORPHONE HCL 1 MG/ML IJ SOLN
0.5000 mg | Freq: Once | INTRAMUSCULAR | Status: DC
  Filled 2017-01-22: qty 1

## 2017-01-22 MED ORDER — METOCLOPRAMIDE HCL 5 MG/ML IJ SOLN
10.0000 mg | Freq: Once | INTRAMUSCULAR | Status: AC
  Administered 2017-01-22: 10 mg via INTRAVENOUS
  Filled 2017-01-22: qty 2

## 2017-01-22 NOTE — ED Provider Notes (Signed)
[facility]-EMERGENCY DEPT Provider Note   CSN: [id number] Arrival date & time: 01/22/17  0515     History   Chief Complaint Chief Complaint  Patient presents with  . Flank Pain  . Nausea    HPI Chelsea Becker is a 21 y.o. female.  HPI   Patient is a 21 year old female with a history of ovarian cysts and no abdominal surgical history presenting for left flank and abdominal pain.  Patient reports that she began having pain 4 days ago in her left flank, and has been  migrating around to the lower left side of abdomen  Patient reports that pain is colicky in nature, and will be intermittently sharp and 10 out of 10 in severity.  No constant pain. Patient denies any dysuria or hematuria.  Patient denies any fever or chills during this course.  Patient reports she has been nauseous without vomiting.  Patient reports 1 episode of loose stool this week, but none further.  Patient reports pain is preventing her from sleeping. Patient reports that last mental period was 3 weeks ago.  Patient has had no vaginal discharge or vaginal bleeding.  Patient reports she is not sexually active.  Patient has tried ibuprofen and Tylenol for the pain, and ibuprofen has relieved the pain minimally.  History reviewed. No pertinent past medical history.  There are no active problems to display for this patient.   History reviewed. No pertinent surgical history.  OB History    Gravida Para Term Preterm AB Living   1             SAB TAB Ectopic Multiple Live Births                   Home Medications    Prior to Admission medications   Medication Sig Start Date End Date Taking? Authorizing Provider  acetaminophen (TYLENOL) 500 MG tablet Take 1,000 mg by mouth every 6 (six) hours as needed for mild pain or headache.    Yes [provider]  ibuprofen (ADVIL,MOTRIN) 200 MG tablet Take 400 mg by mouth every 6 (six) hours as needed for moderate pain.   Yes [provider]  JUNEL FE 1.5/30 1.5-30 MG-MCG tablet Take 1 tablet by mouth daily. 10/24/15  Yes [provider]  cephALEXin (KEFLEX) 500 MG capsule Take 1 capsule (500 mg total) by mouth 3 (three) times daily. Patient not taking: Reported on 01/26/2016 11/16/15   Azalia Bilis, MD  ibuprofen (ADVIL,MOTRIN) 800 MG tablet Take 1 tablet (800 mg total) by mouth every 8 (eight) hours as needed for mild pain or moderate pain. Patient not taking: Reported on 01/26/2016 11/22/15   Trixie Dredge, PA-C  metroNIDAZOLE (FLAGYL) 500 MG tablet Take 1 tablet (500 mg total) by mouth 2 (two) times daily. One po bid x 7 days Patient not taking: Reported on 01/26/2016 11/22/15   Trixie Dredge, PA-C    Family History History reviewed. No pertinent family history.  Social History Social History  Substance Use Topics  . Smoking status: Never Smoker  . Smokeless tobacco: Never Used  . Alcohol use No     Allergies   Latex and Penicillins   Review of Systems Review of Systems  Constitutional: Negative for chills and fever.  HENT: Negative for congestion, rhinorrhea and sore throat.   Eyes: Negative for visual disturbance.  Respiratory: Negative for cough, chest tightness and shortness of breath.   Cardiovascular: Negative for chest pain.  Gastrointestinal: Positive  for abdominal pain and nausea. Negative for diarrhea and vomiting.  Genitourinary: Positive for flank pain. Negative for dysuria, vaginal bleeding and vaginal discharge.  Musculoskeletal: Negative for myalgias.  Skin: Negative for rash.  Neurological: Negative for dizziness, light-headedness and headaches.     Physical Exam Updated Vital Signs BP 107/80 (BP Location: Right Arm)   Pulse 87   Temp 97.7 F (36.5 C) (Oral)   Resp 17   Ht 5\' 5"  (1.651 m)   Wt 46.7 kg (103 lb)   LMP 01/08/2017   SpO2 99%   BMI 17.14 kg/m   Physical Exam  Constitutional: She appears well-developed and well-nourished. No distress.  HENT:  Head:  Normocephalic and atraumatic.  Mouth/Throat: Oropharynx is clear and moist.  Eyes: Pupils are equal, round, and reactive to light. Conjunctivae and EOM are normal.  Neck: Normal range of motion. Neck supple.  Cardiovascular: Normal rate, regular rhythm, S1 normal and S2 normal.   No murmur heard. Pulmonary/Chest: Effort normal and breath sounds normal. She has no wheezes. She has no rales.  Abdominal: Soft. She exhibits no distension. There is tenderness. There is no guarding.  LLQ tenderness. + CVA left tenderness.  No right lower quadrant or right CVA tenderness.  Musculoskeletal: Normal range of motion. She exhibits no edema or deformity.  Lymphadenopathy:    She has no cervical adenopathy.  Neurological: She is alert.  Cranial nerves grossly intact. Patient was extremities symmetrically with good coordination.  Skin: Skin is warm and dry. No rash noted. No erythema.  Psychiatric: She has a normal mood and affect. Her behavior is normal. Judgment and thought content normal.  Nursing note and vitals reviewed.    ED Treatments / Results  Labs (all labs ordered are listed, but only abnormal results are displayed) Labs Reviewed  URINALYSIS, ROUTINE W REFLEX MICROSCOPIC - Abnormal; Notable for the following:       Result Value   Leukocytes, UA TRACE (*)    Squamous Epithelial / LPF 0-5 (*)    All other components within normal limits  LIPASE, BLOOD  COMPREHENSIVE METABOLIC PANEL  CBC  I-STAT BETA HCG BLOOD, ED ([facility], [facility], [facility] ONLY)    EKG  EKG Interpretation  Date/Time:  Friday January 22 2017 07:25:02 EDT Ventricular Rate:  79 PR Interval:    QRS Duration: 88 QT Interval:  385 QTC Calculation: 442 R Axis:   77 Text Interpretation:  Sinus rhythm RSR' in V1 or V2, probably normal variant No old tracing to compare Confirmed by NoraJacubowitz, Doreatha MartinSam [id number]) on 01/22/2017 7:32:46 AM       Radiology Ct Renal Stone Study  Result Date: 01/22/2017 CLINICAL DATA:  Acute left flank  pain. EXAM: CT ABDOMEN AND PELVIS WITHOUT CONTRAST TECHNIQUE: Multidetector CT imaging of the abdomen and pelvis was performed following the standard protocol without IV contrast. COMPARISON:  CT scan of November 16, 2015. FINDINGS: Lower chest: No acute abnormality. Hepatobiliary: No focal liver abnormality is seen. No gallstones, gallbladder wall thickening, or biliary dilatation. Pancreas: Unremarkable. No pancreatic ductal dilatation or surrounding inflammatory changes. Spleen: Normal in size without focal abnormality. Adrenals/Urinary Tract: Adrenal glands are unremarkable. Kidneys are normal, without renal calculi, focal lesion, or hydronephrosis. Bladder is unremarkable. Stomach/Bowel: Stomach is within normal limits. Appendix appears normal. No evidence of bowel wall thickening, distention, or inflammatory changes. Vascular/Lymphatic: No significant vascular findings are present. No enlarged abdominal or pelvic lymph nodes. Reproductive: Uterus and bilateral adnexa are unremarkable. Other: No abdominal wall hernia or abnormality. No  abdominopelvic ascites. Musculoskeletal: No acute or significant osseous findings. IMPRESSION: No abnormality seen in the abdomen or pelvis. Electronically Signed   By: Lupita Raider, M.D.   On: 01/22/2017 08:05    Procedures Procedures (including critical care time)  Medications Ordered in ED Medications  sodium chloride 0.9 % bolus 1,000 mL (1,000 mLs Intravenous New Bag/Given 01/22/17 0758)  metoCLOPramide (REGLAN) injection 10 mg (10 mg Intravenous Given 01/22/17 0758)     Initial Impression / Assessment and Plan / ED Course  I have reviewed the triage vital signs and the nursing notes.  Pertinent labs & imaging results that were available during my care of the patient were reviewed by me and considered in my medical decision making (see chart for details).     Final Clinical Impressions(s) / ED Diagnoses   Final diagnoses:  Left flank pain   Patient is  afebrile nontoxic-appearing.  Differential diagnosis includes nephrolithiasis, pyelonephritis, ruptured ovarian cyst, constipation.   Lab work is reassuring today.  No abnormalities of kidney, liver function.  No leukocytosis.  No evidence of anemia.  Urinalysis demonstrates no evidence of infection or hematuria.   On reevaluation, patient is well-appearing and asymptomatic of pain.  Given the patient is a symptomatically pain on reevaluation, doubt ovarian torsion as pathology.  Patient is not pregnant, so ruptured ectopic ruled out.  No evidence on CT or urinalysis of pyelonephritis. No pelvic free fluid to suggest ovarian cyst rupture and no cysts seen on CT. Pain episodes are brief and fleeting, and I suspect to be muscular skeletal nature.  Patient did have a fall on an ice rink the week prior to presentation.  Patient is reporting that she feels better than she has during the week.  Spoke with patient and mother regarding diagnostic uncertainty, and they are agreeable to watch and wait with patient over the weekend with strict return precautions.  Patient and her mother are in understanding agree with plan of care.  Return precautions given for any focal abdominal tenderness, intractable nausea or vomiting, fever or chills with symptoms, dysuria, hematuria, or development of pelvic symptoms.  This is a shared visit with Dr. Linna Caprice. Patient was independently evaluated by this attending physician. Attending physician consulted in evaluation and discharge management.  New Prescriptions New Prescriptions   No medications on file     Delia Chimes 01/22/17 0935    Doug Sou, MD 01/22/17 [phone]

## 2017-01-22 NOTE — ED Triage Notes (Signed)
Patient complaining of left flank pain that started Monday. Patient states it has gotten worse. She states it started on the left side and move to the front around her abdomen. Patient has been nauseas and fatigue.

## 2017-01-22 NOTE — Discharge Instructions (Signed)
Please see the information and instructions below regarding your visit.  Your diagnoses today include:  1. Left flank pain     Your exam and testing today is reassuring that there is not a condition causing your abdominal pain/flank pain that we immediately need to intervene on at this time.   Abdominal (belly) pain or flank pain can be caused by many things. Your caregiver performed an examination and possibly ordered blood/urine tests and imaging (CT scan, x-rays, ultrasound). Many cases can be observed and treated at home after initial evaluation in the emergency department. Even though you are being discharged home, abdominal pain can be unpredictable. Therefore, you need a repeated exam if your pain does not resolve, returns, or worsens. Most patients with abdominal pain don't have to be admitted to the hospital or have surgery, but serious problems like appendicitis and gallbladder attacks can start out as nonspecific pain. Many abdominal conditions cannot be diagnosed in one visit, so follow-up evaluations are very important.  Tests performed today include: Blood counts and electrolytes Blood tests to check liver and kidney function Blood tests to check pancreas function Urine test to look for infection and pregnancy (in women) Vital signs. See below for your results today.   See side panel of your discharge paperwork for testing performed today. Vital signs are listed at the bottom of these instructions.   Medications prescribed:    Take any prescribed medications only as prescribed, and any over the counter medications only as directed on the packaging.  Home care instructions:  Try eating, but start with foods that have a lot of fluid in them. Good examples are soup, Jell-O, and popsicles. If you do OK with those foods, you can try soft, bland foods, such as plain yogurt. Foods that are high in carbohydrates ("carbs"), like bread or saltine crackers, can help settle your stomach.  Some people also find that ginger helps with nausea. You should avoid foods that have a lot of fat in them. They can make nausea worse. Call your doctor if your symptoms come back when you try to eat.  Please maintain good water intake, drinking water so that your urine is strong color.  Please maintain high fiber intake so that you are having normal bowel movements.  Please follow any educational materials contained in this packet.   Follow-up instructions: Please follow-up with your primary care provider in as soon as possible for further evaluation of your symptoms if they are not completely improved.   Return instructions:  Please return to the Emergency Department if you experience worsening symptoms.  SEEK IMMEDIATE MEDICAL ATTENTION IF: The pain does not go away or becomes severe  A temperature above 101F develops  Repeated vomiting occurs (multiple episodes)  The pain becomes localized to portions of the abdomen. The right side could possibly be appendicitis. In an adult, the left lower portion of the abdomen could be colitis or diverticulitis.  Blood is being passed in stools or vomit (bright red or black tarry stools)  You develop chest pain, difficulty breathing, dizziness or fainting, or become confused, poorly responsive, or inconsolable (young children) If you have any other emergent concerns regarding your health  Additional Information:   Your vital signs today were: BP 107/80 (BP Location: Right Arm)    Pulse 87    Temp 97.7 F (36.5 C) (Oral)    Resp 17    Ht 5\' 5"  (1.651 m)    Wt 46.7 kg (103 lb)  LMP 01/08/2017    SpO2 99%    BMI 17.14 kg/m  If your blood pressure (BP) was elevated on multiple readings during this visit above 130 for the top number or above 80 for the bottom number, please have this repeated by your primary care provider within one month. --------------  Thank you for allowing us to participate in your care today.

## 2017-01-22 NOTE — ED Provider Notes (Signed)
Complains of left flank pain rating to left upper quadrant onset 4 days ago pain is intermittent lasts a few seconds at a time.  Treated with Tylenol and Advil with partial relief.  She has never had similar pain before.  Presently pain-free.  On exam alert nontoxic lungs clear to auscultation heart regular rate and rhythm abdomen nondistended normal active bowel sounds minimally tender at left upper quadrant without guarding rigidity or rebound.  Back without flank tenderness or point tenderness.   Doug SouJacubowitz, Tejasvi Brissett, MD 01/22/17 [phone]

## 2020-08-28 ENCOUNTER — Other Ambulatory Visit (INDEPENDENT_AMBULATORY_CARE_PROVIDER_SITE_OTHER): Payer: Self-pay

## 2020-08-28 ENCOUNTER — Encounter: Payer: Self-pay | Admitting: Nurse Practitioner

## 2020-08-28 ENCOUNTER — Other Ambulatory Visit: Payer: Self-pay

## 2020-08-28 ENCOUNTER — Ambulatory Visit (INDEPENDENT_AMBULATORY_CARE_PROVIDER_SITE_OTHER): Payer: Self-pay | Admitting: Nurse Practitioner

## 2020-08-28 VITALS — BP 100/80 | HR 84 | Ht 64.75 in | Wt 94.5 lb

## 2020-08-28 DIAGNOSIS — R634 Abnormal weight loss: Secondary | ICD-10-CM

## 2020-08-28 DIAGNOSIS — K5900 Constipation, unspecified: Secondary | ICD-10-CM

## 2020-08-28 DIAGNOSIS — R197 Diarrhea, unspecified: Secondary | ICD-10-CM

## 2020-08-28 DIAGNOSIS — R1032 Left lower quadrant pain: Secondary | ICD-10-CM

## 2020-08-28 DIAGNOSIS — R1030 Lower abdominal pain, unspecified: Secondary | ICD-10-CM

## 2020-08-28 LAB — CBC WITH DIFFERENTIAL/PLATELET
Basophils Absolute: 0.1 10*3/uL (ref 0.0–0.1)
Basophils Relative: 0.8 % (ref 0.0–3.0)
Eosinophils Absolute: 0.2 10*3/uL (ref 0.0–0.7)
Eosinophils Relative: 2.3 % (ref 0.0–5.0)
HCT: 43 % (ref 36.0–46.0)
Hemoglobin: 14.6 g/dL (ref 12.0–15.0)
Lymphocytes Relative: 34.4 % (ref 12.0–46.0)
Lymphs Abs: 2.6 10*3/uL (ref 0.7–4.0)
MCHC: 34 g/dL (ref 30.0–36.0)
MCV: 92.1 fl (ref 78.0–100.0)
Monocytes Absolute: 0.5 10*3/uL (ref 0.1–1.0)
Monocytes Relative: 6.9 % (ref 3.0–12.0)
Neutro Abs: 4.2 10*3/uL (ref 1.4–7.7)
Neutrophils Relative %: 55.6 % (ref 43.0–77.0)
Platelets: 333 10*3/uL (ref 150.0–400.0)
RBC: 4.66 Mil/uL (ref 3.87–5.11)
RDW: 12.8 % (ref 11.5–15.5)
WBC: 7.5 10*3/uL (ref 4.0–10.5)

## 2020-08-28 LAB — COMPREHENSIVE METABOLIC PANEL
ALT: 9 U/L (ref 0–35)
AST: 14 U/L (ref 0–37)
Albumin: 4.8 g/dL (ref 3.5–5.2)
Alkaline Phosphatase: 46 U/L (ref 39–117)
BUN: 11 mg/dL (ref 6–23)
CO2: 21 mEq/L (ref 19–32)
Calcium: 9.7 mg/dL (ref 8.4–10.5)
Chloride: 105 mEq/L (ref 96–112)
Creatinine, Ser: 0.87 mg/dL (ref 0.40–1.20)
GFR: 92.25 mL/min (ref >60.00)
Glucose, Bld: 82 mg/dL (ref 70–99)
Potassium: 4 mEq/L (ref 3.5–5.1)
Sodium: 137 mEq/L (ref 135–145)
Total Bilirubin: 0.3 mg/dL (ref 0.2–1.2)
Total Protein: 8 g/dL (ref 6.0–8.3)

## 2020-08-28 LAB — C-REACTIVE PROTEIN: CRP: <1 mg/dL (ref 0.5–20.0)

## 2020-08-28 LAB — TSH: TSH: 3.02 u[IU]/mL (ref 0.35–4.50)

## 2020-08-28 MED ORDER — DICYCLOMINE HCL 10 MG PO CAPS: 10.0000 mg | ORAL_CAPSULE | Freq: Three times a day (TID) | ORAL | 1 refills | Status: DC | PRN

## 2020-08-28 NOTE — Patient Instructions (Addendum)
If you are age 26 or younger, your body mass index should be between 19-25. Your Body mass index is 15.85 kg/m. If this is out of the aformentioned range listed, please consider follow up with your Primary Care Provider.   IMAGING: You will be contacted by [facility] Scheduling (Your caller ID will indicate phone # [phone]) in the next 2 days to schedule your Abdomianl CT Scan. If you have not heard from them within 2 business days, please call [facility] Scheduling at [phone] to follow up on the status of your appointment.    It has been recommended to you by your physician that you have a(n) Colonoscopy completed. Per your request, we did not schedule the procedure(s) today. Please contact our office at [phone] once you obtain insurance. You will be scheduled for a pre-visit and procedure at that time.  MEDICATION: We have sent the following medication to your pharmacy for you to pick up at your convenience: Dicyclomine 10 MG tablet, take 1 three times a day as needed for abdominal pain.  RECOMMENDATIONS: Vear Clock Bacteria Probiotic once a day. Miralax- Dissolve one capful in 8 ounces of water and drink before bed. Please call our office if your symptoms worsen. Follow up with Dr. Adela Lank in 8 weeks.  It was great seeing you today! Thank you for entrusting me with your care and choosing [facility].  Arnaldo Natal, CRNP
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
## 2020-08-29 LAB — IGA: Immunoglobulin A: 189 mg/dL (ref 47–310)

## 2020-08-29 LAB — TISSUE TRANSGLUTAMINASE ABS,IGG,IGA
(tTG) Ab, IgA: <1 U/mL
(tTG) Ab, IgG: <1 U/mL

## 2020-08-29 NOTE — Progress Notes (Signed)
Agree with assessment and plan as outlined.  

## 2020-08-30 NOTE — Progress Notes (Signed)
Patient notified of normal results.

## 2020-09-06 ENCOUNTER — Ambulatory Visit (HOSPITAL_COMMUNITY)
Admission: RE | Admit: 2020-09-06 | Discharge: 2020-09-06 | Disposition: A | Discharge status: Home / Self Care | Payer: Self-pay | Source: Ambulatory Visit | Attending: Nurse Practitioner | Admitting: Nurse Practitioner

## 2020-09-06 ENCOUNTER — Other Ambulatory Visit: Payer: Self-pay

## 2020-09-06 DIAGNOSIS — R634 Abnormal weight loss: Secondary | ICD-10-CM | POA: Insufficient documentation

## 2020-09-06 DIAGNOSIS — K5900 Constipation, unspecified: Secondary | ICD-10-CM | POA: Insufficient documentation

## 2020-09-06 DIAGNOSIS — R1032 Left lower quadrant pain: Secondary | ICD-10-CM | POA: Insufficient documentation

## 2020-09-06 DIAGNOSIS — R197 Diarrhea, unspecified: Secondary | ICD-10-CM | POA: Insufficient documentation

## 2020-09-09 ENCOUNTER — Telehealth: Payer: Self-pay

## 2020-09-09 NOTE — Telephone Encounter (Signed)
Chelsea Becker,  I have spoken to the patient and let her know if the CT results and asked her for an update.  Patient states she has been taking the dicyclomine for pain and it has been helping. When she does need to take it she take 2-3 of them a day.  Patient states she makes 3-4 trips to the bathroom and have been having diarrhea usually twice a day. When she does get the pain and cramping but no diarrhea, she uses a heating pad on her stomach whch seems to help. Patient has not had any constipation since starting the probiotic and Miralax.   Appetite has not changed since she was here but has been trying to seat small sized snack meals each day.  Should she wait to see Dr. Adela Lank until she has her colonoscopy this fall when she get insurance or will he like to see her beforehand?

## 2020-09-09 NOTE — Progress Notes (Signed)
See phone note

## 2020-09-09 NOTE — Telephone Encounter (Signed)
LM for patient to call back.

## 2020-09-09 NOTE — Telephone Encounter (Signed)
-----   Message from Arnaldo Natal, NP sent at 09/09/2020  6:49 AM EDT ----- Efraim Kaufmann, pls inform pt her CTAP was negative. Pls provide sx update. Pt to call office if sx worsen prior to her follow up appt with Dr. Adela Lank. Thx

## 2020-09-13 NOTE — Telephone Encounter (Signed)
LM for patient

## 2020-09-16 NOTE — Telephone Encounter (Signed)
Patient will wait until this Fall when she obtains insurance unless her sx get worse.

## 2020-09-19 ENCOUNTER — Other Ambulatory Visit: Payer: Self-pay | Admitting: Nurse Practitioner

## 2020-10-24 ENCOUNTER — Other Ambulatory Visit: Payer: Self-pay | Admitting: Nurse Practitioner

## 2020-11-07 ENCOUNTER — Other Ambulatory Visit: Payer: Self-pay | Admitting: Nurse Practitioner

## 2021-10-16 IMAGING — CT CT ABD-PELV W/O CM
2 of 4 series · 16 of 46 positions shown, 18 images · non-contrast
Comparison: 01/22/2017

CLINICAL DATA: Left lower quadrant pain. Nausea and vomiting.
Diarrhea. Weight loss.

EXAM:
CT ABDOMEN AND PELVIS WITHOUT CONTRAST
TECHNIQUE: Multidetector CT imaging of the abdomen and pelvis was performed
following the standard protocol without IV contrast.

[Series 2: axial st · axial · 0.69mm/px · z∈[+929,+1309]mm · 13 of 86 slices shown, 15 images]
[im 5/86  soft-tissue]
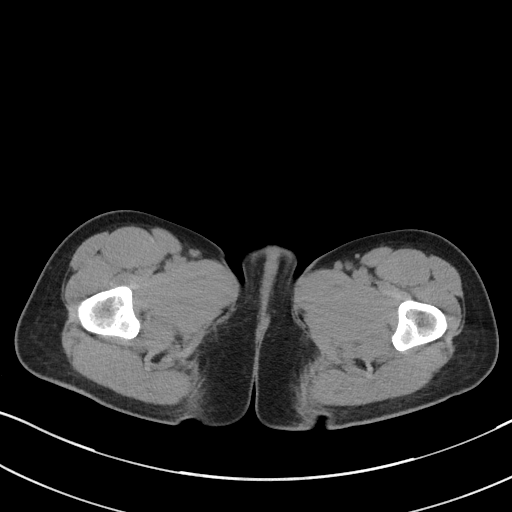
[im 5/86  bone]
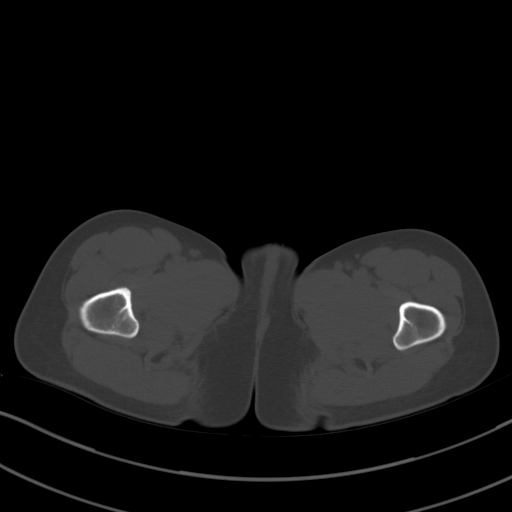
[im 10/86  soft-tissue]
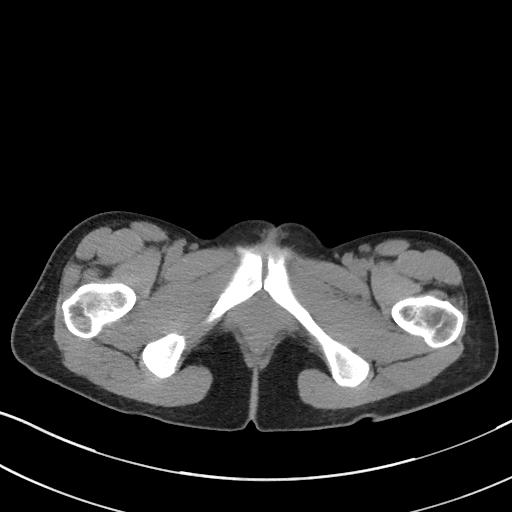
[im 19/86  soft-tissue]
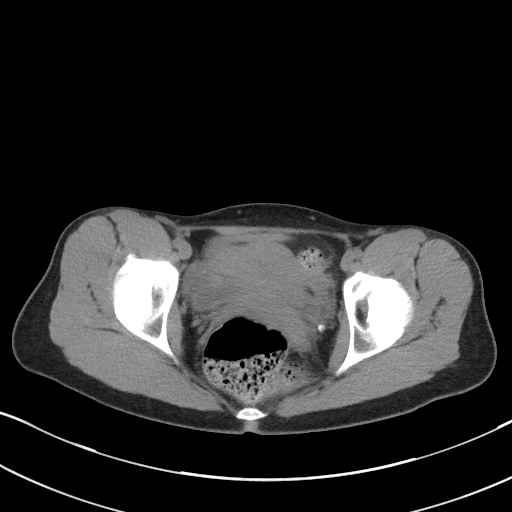
[im 24/86  soft-tissue]
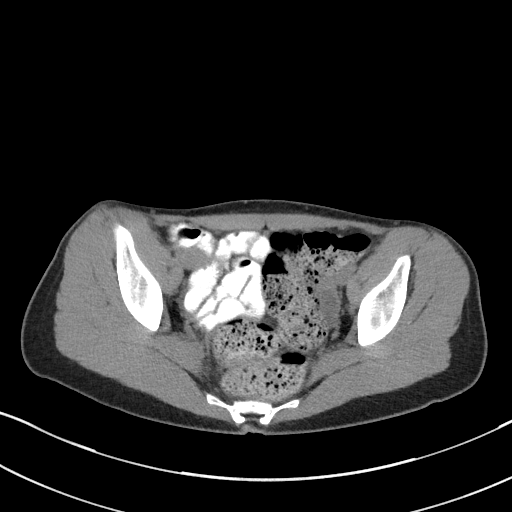
[im 29/86  soft-tissue]
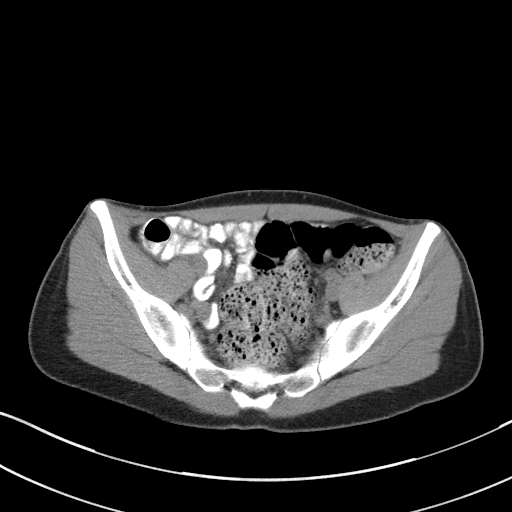
[im 38/86  soft-tissue]
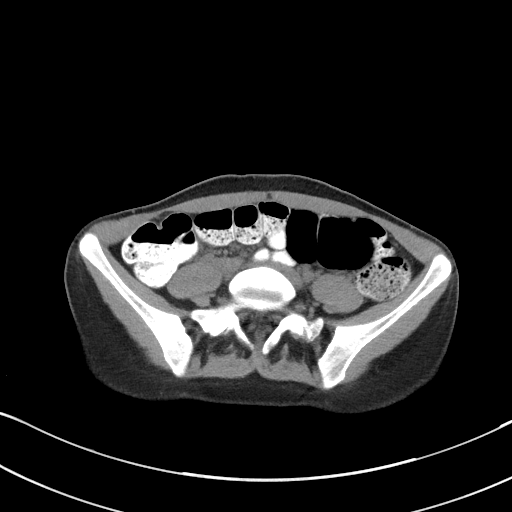
[im 43/86  soft-tissue]
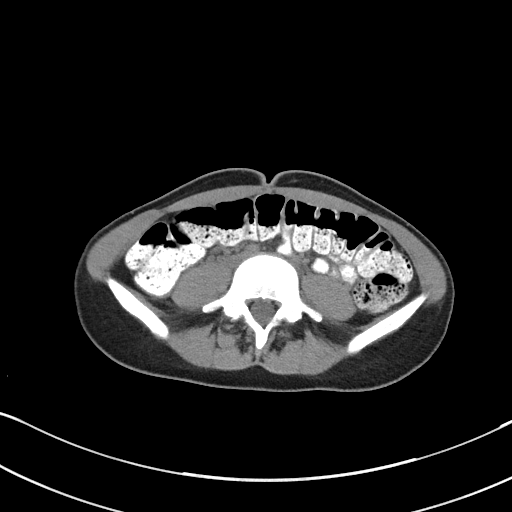
[im 48/86  soft-tissue]
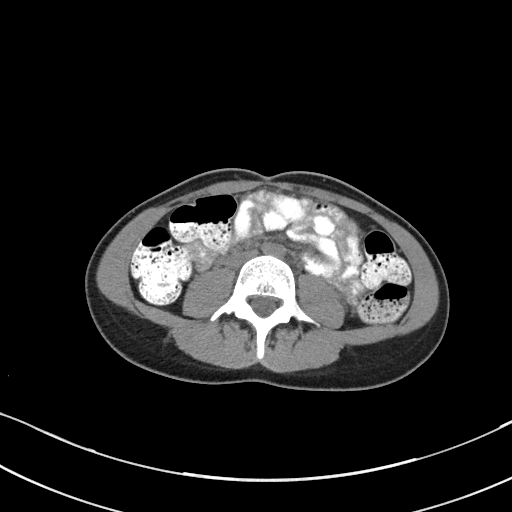
[im 57/86  soft-tissue]
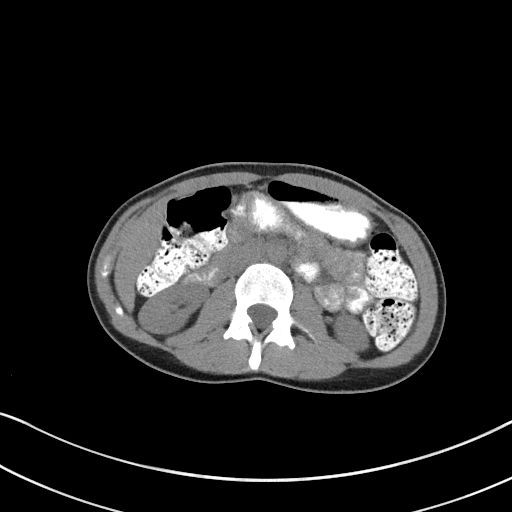
[im 57/86  bone]
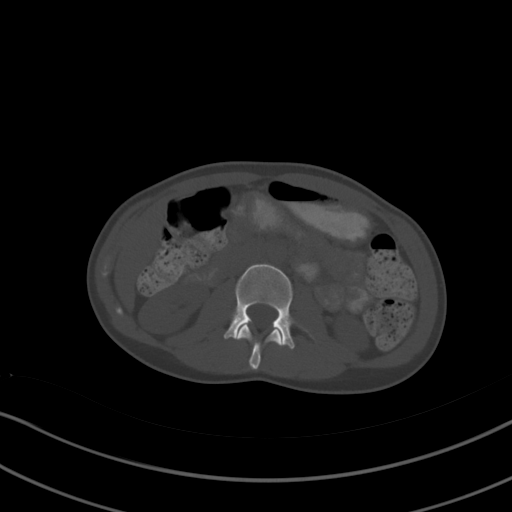
[im 62/86  soft-tissue]
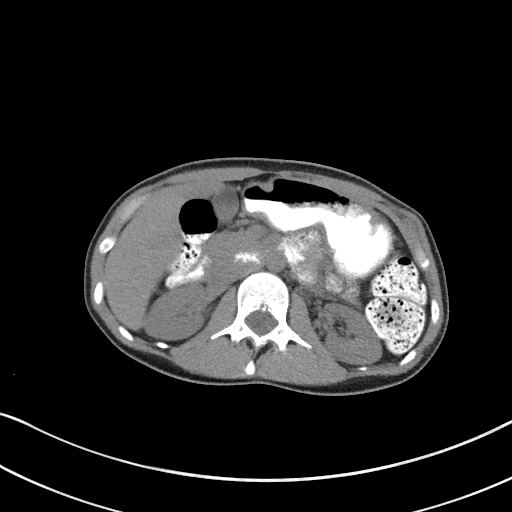
[im 67/86  soft-tissue]
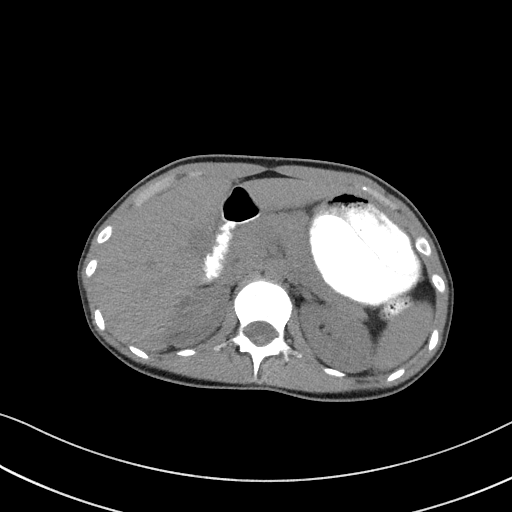
[im 76/86  soft-tissue]
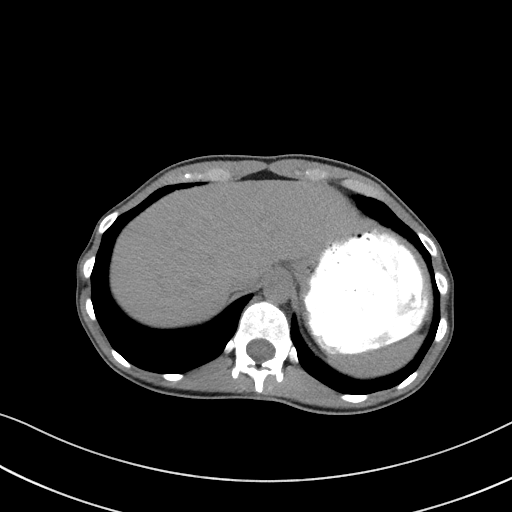
[im 81/86  soft-tissue]
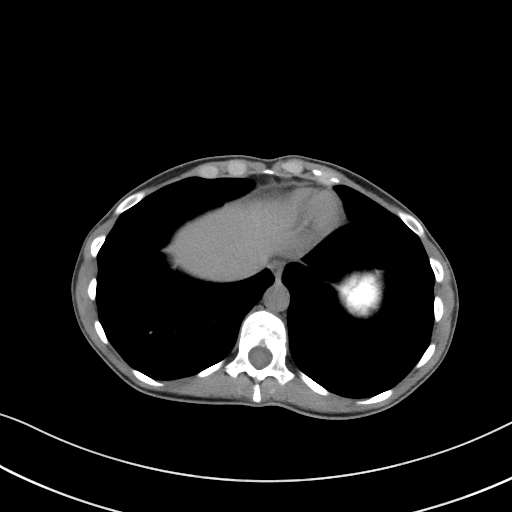

[Series 5: coronal st · coronal · 0.68mm/px · 3 of 87 slices shown]
[im 29/87  soft-tissue]
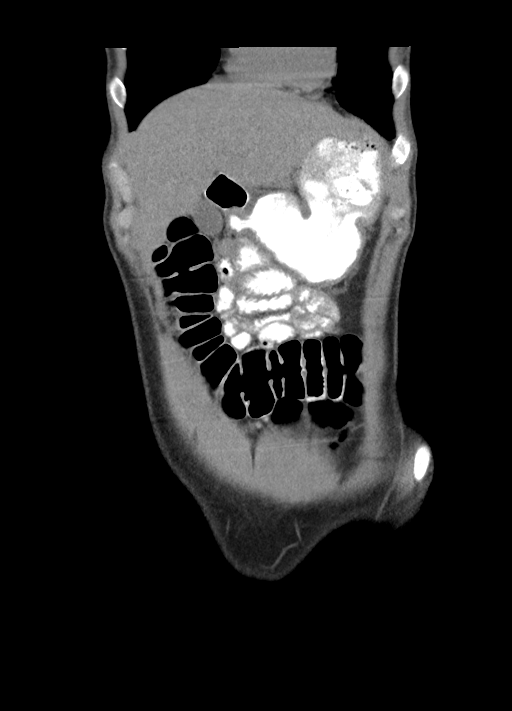
[im 39/87  soft-tissue]
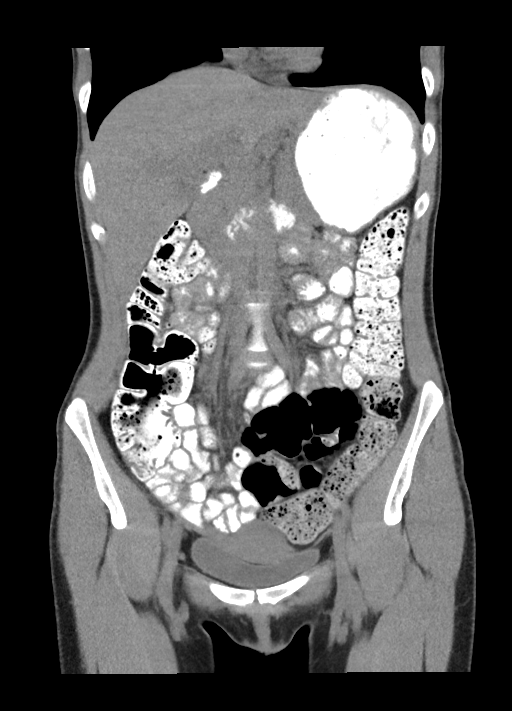
[im 48/87  soft-tissue]
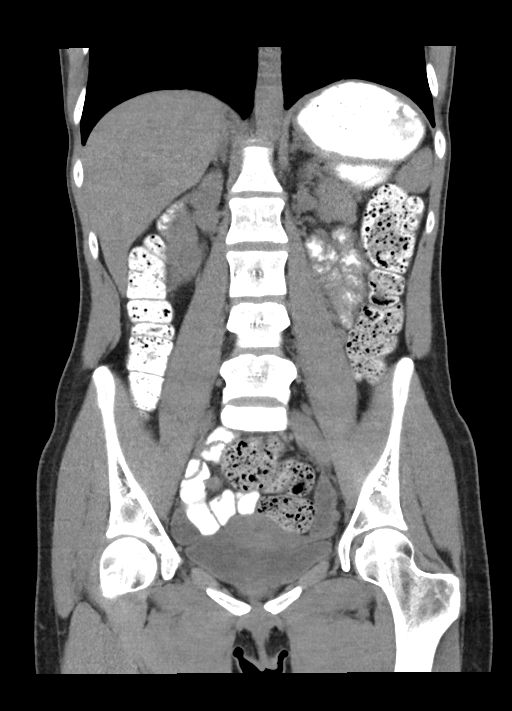

[16 of 46 positions shown; findings below may reference images not displayed]

FINDINGS: Lower chest: No acute findings.

Hepatobiliary: No mass visualized on this unenhanced exam.
Gallbladder is unremarkable. No evidence of biliary ductal
dilatation.

Pancreas: No mass or inflammatory process visualized on this
unenhanced exam.

Spleen:  Within normal limits in size.

Adrenals/Urinary tract: No evidence of urolithiasis or
hydronephrosis. Unremarkable unopacified urinary bladder.

Stomach/Bowel: No evidence of obstruction, inflammatory process, or
abnormal fluid collections. Normal appendix visualized.

Vascular/Lymphatic: No pathologically enlarged lymph nodes
identified. No evidence of abdominal aortic aneurysm.

Reproductive:  No mass or other significant abnormality.

Other:  None.

Musculoskeletal:  No suspicious bone lesions identified.
IMPRESSION: No acute findings or other significant abnormality.

## 2022-08-25 DIAGNOSIS — Z01419 Encounter for gynecological examination (general) (routine) without abnormal findings: Secondary | ICD-10-CM | POA: Diagnosis not present

## 2022-08-25 DIAGNOSIS — N926 Irregular menstruation, unspecified: Secondary | ICD-10-CM | POA: Diagnosis not present

## 2022-08-25 DIAGNOSIS — Z681 Body mass index (BMI) 19 or less, adult: Secondary | ICD-10-CM | POA: Diagnosis not present

## 2022-09-17 DIAGNOSIS — N393 Stress incontinence (female) (male): Secondary | ICD-10-CM | POA: Diagnosis not present

## 2022-09-17 DIAGNOSIS — M6281 Muscle weakness (generalized): Secondary | ICD-10-CM | POA: Diagnosis not present

## 2022-10-07 DIAGNOSIS — N393 Stress incontinence (female) (male): Secondary | ICD-10-CM | POA: Diagnosis not present

## 2022-10-07 DIAGNOSIS — K5900 Constipation, unspecified: Secondary | ICD-10-CM | POA: Diagnosis not present

## 2022-10-28 DIAGNOSIS — K5900 Constipation, unspecified: Secondary | ICD-10-CM | POA: Diagnosis not present

## 2022-10-28 DIAGNOSIS — N393 Stress incontinence (female) (male): Secondary | ICD-10-CM | POA: Diagnosis not present

## 2022-11-18 DIAGNOSIS — K5900 Constipation, unspecified: Secondary | ICD-10-CM | POA: Diagnosis not present

## 2022-11-18 DIAGNOSIS — N393 Stress incontinence (female) (male): Secondary | ICD-10-CM | POA: Diagnosis not present

## 2022-12-15 DIAGNOSIS — N393 Stress incontinence (female) (male): Secondary | ICD-10-CM | POA: Diagnosis not present

## 2022-12-15 DIAGNOSIS — K5900 Constipation, unspecified: Secondary | ICD-10-CM | POA: Diagnosis not present

## 2023-01-10 DIAGNOSIS — J00 Acute nasopharyngitis [common cold]: Secondary | ICD-10-CM | POA: Diagnosis not present

## 2023-01-10 DIAGNOSIS — R0981 Nasal congestion: Secondary | ICD-10-CM | POA: Diagnosis not present

## 2023-01-10 DIAGNOSIS — Z1152 Encounter for screening for COVID-19: Secondary | ICD-10-CM | POA: Diagnosis not present

## 2023-01-10 DIAGNOSIS — Z681 Body mass index (BMI) 19 or less, adult: Secondary | ICD-10-CM | POA: Diagnosis not present

## 2023-01-10 DIAGNOSIS — R52 Pain, unspecified: Secondary | ICD-10-CM | POA: Diagnosis not present

## 2023-01-10 DIAGNOSIS — J029 Acute pharyngitis, unspecified: Secondary | ICD-10-CM | POA: Diagnosis not present

## 2023-01-28 DIAGNOSIS — Z681 Body mass index (BMI) 19 or less, adult: Secondary | ICD-10-CM | POA: Diagnosis not present

## 2023-01-28 DIAGNOSIS — Z111 Encounter for screening for respiratory tuberculosis: Secondary | ICD-10-CM | POA: Diagnosis not present

## 2023-05-27 DIAGNOSIS — R300 Dysuria: Secondary | ICD-10-CM | POA: Diagnosis not present

## 2023-05-27 DIAGNOSIS — R109 Unspecified abdominal pain: Secondary | ICD-10-CM | POA: Diagnosis not present
